# Patient Record
Sex: Male | Born: 1975 | Race: Black or African American | Hispanic: No | Marital: Single | Smoking: Current every day smoker
Health system: Southern US, Community
[De-identification: ages and names within clinical notes are randomized; demographics above are authoritative.]

## PROBLEM LIST (undated history)

## (undated) DIAGNOSIS — J45909 Unspecified asthma, uncomplicated: Secondary | ICD-10-CM

## (undated) HISTORY — PX: HIP SURGERY: SHX245

---

## 2016-12-19 ENCOUNTER — Emergency Department (HOSPITAL_COMMUNITY): Payer: Self-pay

## 2016-12-19 ENCOUNTER — Encounter (HOSPITAL_COMMUNITY): Payer: Self-pay | Admitting: Emergency Medicine

## 2016-12-19 DIAGNOSIS — Z5321 Procedure and treatment not carried out due to patient leaving prior to being seen by health care provider: Secondary | ICD-10-CM | POA: Insufficient documentation

## 2016-12-19 DIAGNOSIS — J45909 Unspecified asthma, uncomplicated: Secondary | ICD-10-CM | POA: Insufficient documentation

## 2016-12-19 MED ORDER — ALBUTEROL SULFATE (2.5 MG/3ML) 0.083% IN NEBU
INHALATION_SOLUTION | RESPIRATORY_TRACT | Status: AC
  Filled 2016-12-19: qty 6

## 2016-12-19 MED ORDER — ALBUTEROL SULFATE (2.5 MG/3ML) 0.083% IN NEBU
5.0000 mg | INHALATION_SOLUTION | Freq: Once | RESPIRATORY_TRACT | Status: AC
  Administered 2016-12-19: 5 mg via RESPIRATORY_TRACT

## 2016-12-19 NOTE — ED Triage Notes (Signed)
Pt reports asthma exacerbation X1 week, cough, SOB. Pt has been out of inhaler and does not have PCP

## 2016-12-20 ENCOUNTER — Encounter (HOSPITAL_COMMUNITY): Payer: Self-pay

## 2016-12-20 ENCOUNTER — Emergency Department (HOSPITAL_COMMUNITY)
Admission: EM | Admit: 2016-12-20 | Discharge: 2016-12-20 | Discharge status: Against Medical Advice | Payer: Self-pay | Attending: Emergency Medicine | Admitting: Emergency Medicine

## 2016-12-20 ENCOUNTER — Emergency Department (HOSPITAL_COMMUNITY)
Admission: EM | Admit: 2016-12-20 | Discharge: 2016-12-20 | Disposition: A | Discharge status: Home / Self Care | Payer: Self-pay | Attending: Emergency Medicine | Admitting: Emergency Medicine

## 2016-12-20 DIAGNOSIS — J45901 Unspecified asthma with (acute) exacerbation: Secondary | ICD-10-CM | POA: Insufficient documentation

## 2016-12-20 DIAGNOSIS — F17210 Nicotine dependence, cigarettes, uncomplicated: Secondary | ICD-10-CM | POA: Insufficient documentation

## 2016-12-20 DIAGNOSIS — J4521 Mild intermittent asthma with (acute) exacerbation: Secondary | ICD-10-CM

## 2016-12-20 HISTORY — DX: Unspecified asthma, uncomplicated: J45.909

## 2016-12-20 MED ORDER — ALBUTEROL SULFATE (2.5 MG/3ML) 0.083% IN NEBU
5.0000 mg | INHALATION_SOLUTION | Freq: Once | RESPIRATORY_TRACT | Status: AC
  Administered 2016-12-20: 5 mg via RESPIRATORY_TRACT

## 2016-12-20 MED ORDER — IPRATROPIUM-ALBUTEROL 0.5-2.5 (3) MG/3ML IN SOLN
3.0000 mL | Freq: Once | RESPIRATORY_TRACT | Status: AC
  Administered 2016-12-20: 3 mL via RESPIRATORY_TRACT
  Filled 2016-12-20: qty 3

## 2016-12-20 MED ORDER — ALBUTEROL SULFATE (2.5 MG/3ML) 0.083% IN NEBU
5.0000 mg | INHALATION_SOLUTION | Freq: Once | RESPIRATORY_TRACT | Status: AC
  Administered 2016-12-20: 5 mg via RESPIRATORY_TRACT
  Filled 2016-12-20: qty 6

## 2016-12-20 MED ORDER — PREDNISONE 10 MG PO TABS: 40.0000 mg | ORAL_TABLET | Freq: Every day | ORAL | 0 refills | Status: AC

## 2016-12-20 MED ORDER — ALBUTEROL SULFATE HFA 108 (90 BASE) MCG/ACT IN AERS
1.0000 | INHALATION_SPRAY | Freq: Four times a day (QID) | RESPIRATORY_TRACT | Status: DC | PRN
  Administered 2016-12-20: 1 via RESPIRATORY_TRACT
  Filled 2016-12-20: qty 6.7

## 2016-12-20 MED ORDER — ALBUTEROL SULFATE (2.5 MG/3ML) 0.083% IN NEBU
INHALATION_SOLUTION | RESPIRATORY_TRACT | Status: DC
Start: 2016-12-20 — End: 2016-12-21
  Filled 2016-12-20: qty 6

## 2016-12-20 MED ORDER — PREDNISONE 20 MG PO TABS
60.0000 mg | ORAL_TABLET | Freq: Once | ORAL | Status: AC
  Administered 2016-12-20: 60 mg via ORAL
  Filled 2016-12-20: qty 3

## 2016-12-20 NOTE — ED Triage Notes (Signed)
Pt. Returned from last night having exacerbation of asthma.  Pt. Left due to wait times.  Pt. Is here again with sob and exacerbation.  Pt. Need an inhaler to go home

## 2016-12-20 NOTE — ED Notes (Signed)
No answer when called to be roomed.

## 2016-12-20 NOTE — ED Notes (Signed)
ED Provider at bedside. 

## 2016-12-20 NOTE — Discharge Instructions (Signed)
Please read instructions below. Take the prednisone, 40 mg, for the next 4 days. Use the inhaler every 6 hours as needed for shortness of breath. Schedule an appointment with primary care to establish care and for management of your asthma. Return to the ER for difficulty breathing that is not relieved by inhaler, or new or concerning symptoms.

## 2016-12-20 NOTE — ED Provider Notes (Signed)
[facility]-EMERGENCY DEPT Provider Note   CSN: [id number] Arrival date & time: 12/20/16  1429     History   Chief Complaint Chief Complaint  Patient presents with  . Shortness of Breath    HPI Clayton Bailey is a 41 y.o. male w PMHx of asthma, presenting with asthma exacerbation that began yesterday evening. Pt reports he ran out of his albuterol yesterday and has been short of breath today. States he has some relief of symptoms after albuterol neb that was done in triage, however is still not at his baseline. Denies URI sx, CP, or any other complaints today. He does not have a primary care.  The history is provided by the patient.    Past Medical History:  Diagnosis Date  . Asthma     There are no active problems to display for this patient.   Past Surgical History:  Procedure Laterality Date  . HIP SURGERY         Home Medications    Prior to Admission medications   Medication Sig Start Date End Date Taking? Authorizing Provider  ePHEDrine-GuaiFENesin (PRIMATENE ASTHMA) 12.5-200 MG TABS Take 2 tablets by mouth daily as needed.   Yes [provider]  predniSONE (DELTASONE) 10 MG tablet Take 4 tablets (40 mg total) by mouth daily. 12/20/16 12/24/16  Russo, [place] N, PA-C    Family History No family history on file.  Social History Social History  Substance Use Topics  . Smoking status: Current Every Day Smoker    Packs/day: 0.25    Types: Cigarettes  . Smokeless tobacco: Never Used  . Alcohol use Yes     Comment: socially     Allergies   Patient has no known allergies.   Review of Systems Review of Systems  Constitutional: Negative for fever.  HENT: Negative for congestion and sore throat.   Respiratory: Positive for cough, shortness of breath and wheezing.   Cardiovascular: Negative for chest pain.  Gastrointestinal: Negative for abdominal pain.  All other systems reviewed and are negative.    Physical Exam Updated Vital Signs BP (!) 137/95    Pulse 87   Temp 98.1 F (36.7 C) (Oral)   Resp 18   Ht 5\' 4"  (1.626 m)   Wt 77.1 kg (170 lb)   SpO2 96%   BMI 29.18 kg/m   Physical Exam  Constitutional: He appears well-developed and well-nourished. No distress.  HENT:  Head: Normocephalic and atraumatic.  Mouth/Throat: Oropharynx is clear and moist.  Eyes: Conjunctivae are normal.  Neck: Normal range of motion. No tracheal deviation present.  Cardiovascular: Normal rate, regular rhythm, normal heart sounds and intact distal pulses.   Pulmonary/Chest: Effort normal. No stridor. No respiratory distress. He has no rales.  B/l inspiratory and expiratory wheezes and rhonchi. No increased work of breathing  Abdominal: Soft. Bowel sounds are normal.  Lymphadenopathy:    He has no cervical adenopathy.  Neurological: He is alert.  Skin: Skin is warm.  Psychiatric: He has a normal mood and affect. His behavior is normal.  Nursing note and vitals reviewed.    ED Treatments / Results  Labs (all labs ordered are listed, but only abnormal results are displayed) Labs Reviewed - No data to display  EKG  EKG Interpretation None       Radiology Dg Chest 2 View  Result Date: 12/19/2016 CLINICAL DATA:  Asthma exacerbation x1 week with cough. EXAM: CHEST  2 VIEW COMPARISON:  None. FINDINGS: The heart size and mediastinal contours  are within normal limits. The lungs are slightly hyperinflated without pneumonic consolidation. No effusion or pneumothorax. The visualized skeletal structures are unremarkable. IMPRESSION: Hyperinflation of the lungs. Findings may reflect the patient's reported underlying history of asthma. Electronically Signed   By: Tollie Ethavid  Kwon M.D.   On: 12/19/2016 20:42    Procedures Procedures (including critical care time)  Medications Ordered in ED Medications  albuterol (PROVENTIL) (2.5 MG/3ML) 0.083% nebulizer solution (  Not Given 12/20/16 1516)  albuterol (PROVENTIL HFA;VENTOLIN HFA) 108 (90 Base) MCG/ACT  inhaler 1-2 puff (not administered)  albuterol (PROVENTIL) (2.5 MG/3ML) 0.083% nebulizer solution 5 mg (5 mg Nebulization Given 12/20/16 1516)  albuterol (PROVENTIL) (2.5 MG/3ML) 0.083% nebulizer solution 5 mg (5 mg Nebulization Given 12/20/16 1948)  predniSONE (DELTASONE) tablet 60 mg (60 mg Oral Given 12/20/16 1948)  ipratropium-albuterol (DUONEB) 0.5-2.5 (3) MG/3ML nebulizer solution 3 mL (3 mLs Nebulization Given 12/20/16 2103)     Initial Impression / Assessment and Plan / ED Course  I have reviewed the triage vital signs and the nursing notes.  Pertinent labs & imaging results that were available during my care of the patient were reviewed by me and considered in my medical decision making (see chart for details).  Clinical Course as of Dec 21 2138  Fri Dec 20, 2016  2040 Pt reports significant improvement in symptoms after 2nd alb neb. Lung exam slightly improved, however wheezes and rhonchi still present. Will give duoneb.  [JR]    Clinical Course User Index [JR] Russo, [place] N, PA-C    Patient presenting with asthma exacerbation. B/L wheezes on initial exam, not in respiratory distress. CXR neg for PNA. Alb neb x2 and duoneb with gradual improvement in lung exam and symptoms. Prednisone given in the ED and pt will bd dc with 4 day burst. Pt states they are breathing at baseline. Pt has been instructed to continue using prescribed medications and to speak with PCP about today's exacerbation.  Pt is safe for discharge.  Discussed results, findings, treatment and follow up. Patient advised of return precautions. Patient verbalized understanding and agreed with plan.  Final Clinical Impressions(s) / ED Diagnoses   Final diagnoses:  Exacerbation of intermittent asthma, unspecified asthma severity    New Prescriptions New Prescriptions   PREDNISONE (DELTASONE) 10 MG TABLET    Take 4 tablets (40 mg total) by mouth daily.     Russo, [place] N, PA-C 12/20/16 2140    Tilden Fossaees,  Elizabeth, MD 12/21/16 [phone]

## 2017-01-15 ENCOUNTER — Emergency Department (HOSPITAL_COMMUNITY)
Admission: EM | Admit: 2017-01-15 | Discharge: 2017-01-15 | Disposition: A | Discharge status: Home / Self Care | Payer: Self-pay | Attending: Emergency Medicine | Admitting: Emergency Medicine

## 2017-01-15 ENCOUNTER — Encounter (HOSPITAL_COMMUNITY): Payer: Self-pay | Admitting: *Deleted

## 2017-01-15 DIAGNOSIS — R030 Elevated blood-pressure reading, without diagnosis of hypertension: Secondary | ICD-10-CM | POA: Insufficient documentation

## 2017-01-15 DIAGNOSIS — R0602 Shortness of breath: Secondary | ICD-10-CM

## 2017-01-15 DIAGNOSIS — J45901 Unspecified asthma with (acute) exacerbation: Secondary | ICD-10-CM | POA: Insufficient documentation

## 2017-01-15 DIAGNOSIS — Z720 Tobacco use: Secondary | ICD-10-CM

## 2017-01-15 DIAGNOSIS — F17210 Nicotine dependence, cigarettes, uncomplicated: Secondary | ICD-10-CM | POA: Insufficient documentation

## 2017-01-15 MED ORDER — MAGNESIUM SULFATE 2 GM/50ML IV SOLN
2.0000 g | Freq: Once | INTRAVENOUS | Status: AC
  Administered 2017-01-15: 2 g via INTRAVENOUS
  Filled 2017-01-15: qty 50

## 2017-01-15 MED ORDER — ALBUTEROL SULFATE HFA 108 (90 BASE) MCG/ACT IN AERS: 1.0000 | INHALATION_SPRAY | RESPIRATORY_TRACT | 0 refills | Status: AC | PRN

## 2017-01-15 MED ORDER — ALBUTEROL SULFATE (2.5 MG/3ML) 0.083% IN NEBU
5.0000 mg | INHALATION_SOLUTION | Freq: Once | RESPIRATORY_TRACT | Status: AC
  Administered 2017-01-15: 5 mg via RESPIRATORY_TRACT
  Filled 2017-01-15: qty 6

## 2017-01-15 MED ORDER — ALBUTEROL SULFATE (2.5 MG/3ML) 0.083% IN NEBU: 2.5000 mg | INHALATION_SOLUTION | RESPIRATORY_TRACT | 6 refills | Status: AC | PRN

## 2017-01-15 MED ORDER — ALBUTEROL SULFATE HFA 108 (90 BASE) MCG/ACT IN AERS
2.0000 | INHALATION_SPRAY | Freq: Once | RESPIRATORY_TRACT | Status: AC
Start: 2017-01-15 — End: 2017-01-15
  Administered 2017-01-15: 2 via RESPIRATORY_TRACT
  Filled 2017-01-15: qty 6.7

## 2017-01-15 MED ORDER — IPRATROPIUM BROMIDE 0.02 % IN SOLN
0.5000 mg | Freq: Once | RESPIRATORY_TRACT | Status: AC
  Administered 2017-01-15: 0.5 mg via RESPIRATORY_TRACT
  Filled 2017-01-15: qty 2.5

## 2017-01-15 NOTE — ED Provider Notes (Signed)
[facility]-EMERGENCY DEPT Provider Note   CSN: [id number] Arrival date & time: 01/15/17  2138     History   Chief Complaint Chief Complaint  Patient presents with  . Shortness of Breath    HPI Clayton Bailey is a 41 y.o. male with a PMHx of asthma, who presents to the ED with complaints of asthma exacerbation that began this morning around 8 AM. Patient states that he woke up feeling mildly short of breath and having wheezing, used his albuterol inhaler and primatene mist which helped somewhat; he was doing well until about 8 PM when he ran out of his inhaler and was feeling more SOB and having more wheezing at work; he called EMS who administered 1 albuterol neb, 1 duoneb, and 125mg  solumedrol with improvement of his symptoms. He states that his symptoms get aggravated by environmental exposures in his home (dust, allergens). He states this feels exactly the same as his prior asthma attacks. Chart review reveals that he was seen on 12/20/16 in the ED, given 2 albuterol nebs, 60 mg prednisone, and 1 DuoNeb at which time he improved so he was discharged home with prednisone 40 mg for 4 days. He states he never filled it the last time, however he has now taken it to the pharmacy to get it filled and will be picking it up tomorrow. He admits to being a cigarette smoker. He denies any prior intubations or ICU admissions for his asthma. He has a nebulizer machine at home however he does not have refills of albuterol nebulizer solution. He ran out of his inhaler today and doesn't have a refill. He does not currently have a PCP due to lack of insurance. Denies sick contacts. Of note, he has no known diagnosis of HTN, has never been on antiHTN meds.   He denies fevers, chills, cough, URI symptoms, CP, LE swelling, recent travel/surgery/immobilization, personal/family hx of DVT/PE, abd pain, N/V/D/C, hematuria, dysuria, myalgias, arthralgias, HA, vision changes, numbness, tingling, focal weakness, or any other  complaints at this time.   The history is provided by the patient and medical records. No language interpreter was used.  Shortness of Breath  This is a recurrent problem. The average episode lasts 14 hours. The problem occurs frequently.The current episode started 12 to 24 hours ago. The problem has not changed since onset.Associated symptoms include wheezing. Pertinent negatives include no fever, no headaches, no rhinorrhea, no sore throat, no ear pain, no cough, no chest pain, no vomiting, no abdominal pain and no leg swelling. The problem's precipitants include pollens (environmental exposure at home (dust)). Risk factors include smoking. He has tried beta-agonist inhalers for the symptoms. The treatment provided mild relief. He has had no prior hospitalizations. He has had prior ED visits. He has had no prior ICU admissions. Associated medical issues include asthma. Associated medical issues do not include PE, DVT or recent surgery.    Past Medical History:  Diagnosis Date  . Asthma     There are no active problems to display for this patient.   Past Surgical History:  Procedure Laterality Date  . HIP SURGERY         Home Medications    Prior to Admission medications   Medication Sig Start Date End Date Taking? Authorizing Provider  ePHEDrine-GuaiFENesin (PRIMATENE ASTHMA) 12.5-200 MG TABS Take 2 tablets by mouth daily as needed (sob).    Yes [provider]    Family History No family history on file.  Social History Social History  Substance Use Topics  . Smoking status: Current Every Day Smoker    Packs/day: 0.25    Types: Cigarettes  . Smokeless tobacco: Never Used  . Alcohol use Yes     Comment: socially     Allergies   Patient has no known allergies.   Review of Systems Review of Systems  Constitutional: Negative for chills and fever.  HENT: Negative for ear discharge, ear pain, rhinorrhea and sore throat.   Eyes: Negative for visual  disturbance.  Respiratory: Positive for shortness of breath and wheezing. Negative for cough.   Cardiovascular: Negative for chest pain and leg swelling.  Gastrointestinal: Negative for abdominal pain, constipation, diarrhea, nausea and vomiting.  Genitourinary: Negative for dysuria and hematuria.  Musculoskeletal: Negative for arthralgias and myalgias.  Skin: Negative for color change.  Allergic/Immunologic: Negative for immunocompromised state.  Neurological: Negative for weakness, numbness and headaches.  Psychiatric/Behavioral: Negative for confusion.   All other systems reviewed and are negative for acute change except as noted in the HPI.    Physical Exam Updated Vital Signs BP (!) 175/95 (BP Location: Right Arm)   Pulse 90   Temp 98.3 F (36.8 C) (Oral)   Resp 14   SpO2 99%   Physical Exam  Constitutional: He is oriented to person, place, and time. Vital signs are normal. He appears well-developed and well-nourished.  Non-toxic appearance. No distress.  Afebrile, nontoxic, NAD, mildly hypertensive 160s/90s during exam, similar to prior ED visit  HENT:  Head: Normocephalic and atraumatic.  Mouth/Throat: Oropharynx is clear and moist and mucous membranes are normal.  Eyes: Conjunctivae and EOM are normal. Right eye exhibits no discharge. Left eye exhibits no discharge.  Neck: Normal range of motion. Neck supple.  Cardiovascular: Normal rate, regular rhythm, normal heart sounds and intact distal pulses.  Exam reveals no gallop and no friction rub.   No murmur heard. RRR, nl s1/s2, no m/r/g, distal pulses intact, no pedal edema   Pulmonary/Chest: Effort normal. No respiratory distress. He has no decreased breath sounds. He has wheezes. He has no rhonchi. He has no rales.  Diffuse expiratory wheezing throughout all lung fields, no rhonchi/rales, no hypoxia or increased WOB, speaking in full sentences, SpO2 99% on RA   Abdominal: Soft. Normal appearance and bowel sounds are  normal. He exhibits no distension. There is no tenderness. There is no rigidity, no rebound, no guarding, no CVA tenderness, no tenderness at McBurney's point and negative Murphy's sign.  Musculoskeletal: Normal range of motion.  MAE x4 Strength and sensation grossly intact in all extremities Distal pulses intact No pedal edema, neg homan's bilaterally  Neurological: He is alert and oriented to person, place, and time. He has normal strength. No sensory deficit.  Skin: Skin is warm, dry and intact. No rash noted.  Psychiatric: He has a normal mood and affect.  Nursing note and vitals reviewed.    ED Treatments / Results  Labs (all labs ordered are listed, but only abnormal results are displayed) Labs Reviewed - No data to display  EKG  EKG Interpretation None       Radiology No results found.  Procedures Procedures (including critical care time)  Medications Ordered in ED Medications  magnesium sulfate IVPB 2 g 50 mL (2 g Intravenous New Bag/Given 01/15/17 2245)  albuterol (PROVENTIL HFA;VENTOLIN HFA) 108 (90 Base) MCG/ACT inhaler 2 puff (not administered)  albuterol (PROVENTIL) (2.5 MG/3ML) 0.083% nebulizer solution 5 mg (5 mg Nebulization Given 01/15/17 2246)  ipratropium (ATROVENT) nebulizer solution 0.5  mg (0.5 mg Nebulization Given 01/15/17 2250)     Initial Impression / Assessment and Plan / ED Course  I have reviewed the triage vital signs and the nursing notes.  Pertinent labs & imaging results that were available during my care of the patient were reviewed by me and considered in my medical decision making (see chart for details).     41 y.o. male here with asthma exacerbation starting today; used up his inhaler so had to call EMS; Was given 1 albuterol tx, 1 duoneb tx, and 125mg  solumedrol en route which helped. On exam, diffuse expiratory wheezing throughout, no rhonchi/rales, no increased WOB, no hypoxia or tachycardia, no LE swelling. Also noted to be mildly  hypertensive similar to prior visits; at last visit, BP fluctuated even down to the 130s/80s so it could be situational/med related, asymptomatic at this time so doubt need for emergent work up at this time, and doubt need to start HTN meds now, but advised DASH diet, tobacco cessation, and f/up with a PCP for ongoing evaluation/management of this. Doubt need for labs/imaging at this time, will give duoneb and Mg IV to see if we can get resolution of his wheezing; will reassess shortly.   11:13 PM Pt feeling significantly improved and lung sounds greatly improved, still has very faint wheeze in upper lungs, will give 2 puffs of inhaler and then I feel he could reasonable go home (especially since he has nebulizer at home so he can use that if need be as well). Will send home with inhaler, refill albuterol nebs, and advised daily antihistamine use as well as other OTC remedies to help with relief of symptoms. Of note, BP trending down to 140s/80s; advised DASH diet, smoking cessation, and f/up with [facility] in 1wk for recheck and to establish medical care. Pt has prednisone rx from last visit, advised use of this starting tomorrow. I explained the diagnosis and have given explicit precautions to return to the ER including for any other new or worsening symptoms. The patient understands and accepts the medical plan as it's been dictated and I have answered their questions. Discharge instructions concerning home care and prescriptions have been given. The patient is STABLE and is discharged to home in good condition.    Final Clinical Impressions(s) / ED Diagnoses   Final diagnoses:  Exacerbation of asthma, unspecified asthma severity, unspecified whether persistent  SOB (shortness of breath)  Elevated BP without diagnosis of hypertension  Tobacco user    New Prescriptions New Prescriptions   ALBUTEROL (PROVENTIL HFA;VENTOLIN HFA) 108 (90 BASE) MCG/ACT INHALER    Inhale 1-2 puffs into the lungs every 4  (four) hours as needed for wheezing or shortness of breath.   ALBUTEROL (PROVENTIL) (2.5 MG/3ML) 0.083% NEBULIZER SOLUTION    Take 3 mLs (2.5 mg total) by nebulization every 4 (four) hours as needed for wheezing or shortness of breath.     [address], SykestonMercedes, [place] JerseyPA-C 01/15/17 2314    Mancel BaleWentz, Elliott, MD 01/15/17 [phone]

## 2017-01-15 NOTE — ED Notes (Signed)
Bed: WA20 Expected date:  Expected time:  Means of arrival:  Comments: Sob

## 2017-01-15 NOTE — ED Triage Notes (Signed)
Per EMS, pt had sob all day, hx asthma. Pt used inhaler at 8AM today. Pt has prescription for prednisone, which he has not picked up.  Pt received 1 albuterol, 1 duoneb, 125 solumedrol. Pt wheezing in all fields prior to neb treatment. Pt clear in left upper, faint wheezing after neb treatment.  Pt denies pain.  148/76 HR 91 RR18 100% on neb treatment

## 2017-01-15 NOTE — Discharge Instructions (Signed)
Continue to stay well-hydrated. Use Mucinex for cough suppression/expectoration of mucus. Use over the counter antihistamines such as zyrtec or allegra to decrease symptoms and frequency of asthma attacks. Use albuterol inhaler and/or albuterol nebulizer as directed, as needed for cough/chest congestion/wheezing/shortness of breath/etc. Take home prednisone as directed for your asthma exacerbation, starting tomorrow since you received today's dose in the ER today. STOP SMOKING! Your blood pressure was elevated today; eat a low salt diet and follow up with a regular doctor for ongoing management of this. [facility]up with [facility] in 5-7 days for recheck of ongoing symptoms and to establish medical care. Return to emergency department for emergent changing or worsening of symptoms.

## 2019-06-28 ENCOUNTER — Ambulatory Visit: Payer: Self-pay | Attending: Internal Medicine

## 2019-06-30 ENCOUNTER — Ambulatory Visit: Payer: Self-pay | Attending: Internal Medicine

## 2019-06-30 DIAGNOSIS — U071 COVID-19: Secondary | ICD-10-CM | POA: Insufficient documentation

## 2019-06-30 DIAGNOSIS — Z20822 Contact with and (suspected) exposure to covid-19: Secondary | ICD-10-CM

## 2019-07-03 LAB — NOVEL CORONAVIRUS, NAA

## 2019-07-05 ENCOUNTER — Ambulatory Visit: Payer: Self-pay | Attending: Internal Medicine

## 2019-07-05 DIAGNOSIS — Z20822 Contact with and (suspected) exposure to covid-19: Secondary | ICD-10-CM | POA: Insufficient documentation

## 2019-07-06 LAB — NOVEL CORONAVIRUS, NAA: SARS-CoV-2, NAA: NOT DETECTED

## 2020-01-26 ENCOUNTER — Emergency Department (HOSPITAL_COMMUNITY)
Admission: EM | Admit: 2020-01-26 | Discharge: 2020-01-26 | Disposition: A | Discharge status: Home / Self Care | Payer: Self-pay | Attending: Emergency Medicine | Admitting: Emergency Medicine

## 2020-01-26 ENCOUNTER — Other Ambulatory Visit: Payer: Self-pay

## 2020-01-26 ENCOUNTER — Encounter (HOSPITAL_COMMUNITY): Payer: Self-pay

## 2020-01-26 DIAGNOSIS — F17210 Nicotine dependence, cigarettes, uncomplicated: Secondary | ICD-10-CM | POA: Insufficient documentation

## 2020-01-26 DIAGNOSIS — Y990 Civilian activity done for income or pay: Secondary | ICD-10-CM | POA: Insufficient documentation

## 2020-01-26 DIAGNOSIS — S61412A Laceration without foreign body of left hand, initial encounter: Secondary | ICD-10-CM | POA: Insufficient documentation

## 2020-01-26 DIAGNOSIS — Y9389 Activity, other specified: Secondary | ICD-10-CM | POA: Insufficient documentation

## 2020-01-26 DIAGNOSIS — J45909 Unspecified asthma, uncomplicated: Secondary | ICD-10-CM | POA: Insufficient documentation

## 2020-01-26 DIAGNOSIS — W268XXA Contact with other sharp object(s), not elsewhere classified, initial encounter: Secondary | ICD-10-CM | POA: Insufficient documentation

## 2020-01-26 DIAGNOSIS — Y9289 Other specified places as the place of occurrence of the external cause: Secondary | ICD-10-CM | POA: Insufficient documentation

## 2020-01-26 NOTE — ED Triage Notes (Signed)
Patient states last night at 730pm at work he cut his left hand while cutting boxes. He applied liquid bandage and then bust it back open. Bleeding controlled.

## 2020-01-26 NOTE — Discharge Instructions (Addendum)
Keep wound covered to protect from dirt  Steri strips will come off on their own, may reapply if they come off before 5 days  Return for increased pain swelling redness pus

## 2020-01-26 NOTE — ED Provider Notes (Signed)
[facility]-EMERGENCY DEPT Provider Note   CSN: [id number] Arrival date & time: 01/26/20  1911     History Chief Complaint  Patient presents with  . Laceration    Clayton Bailey is a 44 y.o. male presents to the ER for evaluation of laceration in the left hand that occurred last night while at work.  He was cutting boxes.  Sustained a cut from a box cutter.  He applied liquid bandage to the wound last night but states when at work today opened back up.  Has associated local pain.  No more bleeding.  Denies distal tingling or loss of sensation.  Tetanus last around 2014 or 2015.  HPI     Past Medical History:  Diagnosis Date  . Asthma     There are no problems to display for this patient.   Past Surgical History:  Procedure Laterality Date  . HIP SURGERY         No family history on file.  Social History   Tobacco Use  . Smoking status: Current Every Day Smoker    Packs/day: 0.25    Types: Cigarettes  . Smokeless tobacco: Never Used  Substance Use Topics  . Alcohol use: Yes    Comment: socially  . Drug use: Yes    Types: Marijuana    Home Medications Prior to Admission medications   Medication Sig Start Date End Date Taking? Authorizing Provider  albuterol (PROVENTIL HFA;VENTOLIN HFA) 108 (90 Base) MCG/ACT inhaler Inhale 1-2 puffs into the lungs every 4 (four) hours as needed for wheezing or shortness of breath. 01/15/17   Street, [place], PA-C  albuterol (PROVENTIL) (2.5 MG/3ML) 0.083% nebulizer solution Take 3 mLs (2.5 mg total) by nebulization every 4 (four) hours as needed for wheezing or shortness of breath. 01/15/17   Street, [place], PA-C  ePHEDrine-GuaiFENesin (PRIMATENE ASTHMA) 12.5-200 MG TABS Take 2 tablets by mouth daily as needed (sob).     [provider]    Allergies    Patient has no known allergies.  Review of Systems   Review of Systems  Skin: Positive for wound.  All other systems reviewed and are  negative.   Physical Exam Updated Vital Signs BP 135/87   Pulse 76   Temp 98.4 F (36.9 C) (Oral)   Resp 16   SpO2 99%   Physical Exam Constitutional:      Appearance: He is well-developed.  HENT:     Head: Normocephalic.     Nose: Nose normal.  Eyes:     General: Lids are normal.  Cardiovascular:     Rate and Rhythm: Normal rate.     Comments: Radial pulses intact in the left hand.  Brisk cap refill in the left fingertips. Pulmonary:     Effort: Pulmonary effort is normal. No respiratory distress.  Musculoskeletal:        General: Normal range of motion.     Cervical back: Normal range of motion.     Comments: Full range of motion of the left wrist, digits.  Skin:    Comments: Approximate 3 cm straight slightly gaping laceration palmar aspect of the left hand.  Hemostatic.  Minimally locally tender.  No erythema, warmth, fluctuance, drainage.  Neurological:     Mental Status: He is alert.     Comments: Sensation in the radial, medial and ulnar nerve distribution intact in left hand  Psychiatric:        Behavior: Behavior normal.     ED Results /  Procedures / Treatments   Labs (all labs ordered are listed, but only abnormal results are displayed) Labs Reviewed - No data to display  EKG None  Radiology No results found.  Procedures .Marland KitchenLaceration Repair  Date/Time: 01/26/2020 11:09 PM Performed by: Liberty Handy, PA-C Authorized by: Liberty Handy, PA-C   Consent:    Consent obtained:  Verbal   Consent given by:  Patient   Risks discussed:  Infection, need for additional repair, pain, poor cosmetic result and poor wound healing   Alternatives discussed:  No treatment and delayed treatment Universal protocol:    Procedure explained and questions answered to patient or proxy's satisfaction: yes     Patient identity confirmed:  Verbally with patient Anesthesia (see MAR for exact dosages):    Anesthesia method:  None Laceration details:    Length  (cm):  3 Repair type:    Repair type:  Simple Pre-procedure details:    Preparation:  Patient was prepped and draped in usual sterile fashion and imaging obtained to evaluate for foreign bodies Exploration:    Wound extent: no foreign bodies/material noted, no nerve damage noted, no tendon damage noted, no underlying fracture noted and no vascular damage noted     Contaminated: no   Treatment:    Wound cleansed with: alcohol wipe.   Amount of cleaning:  Standard   Irrigation method:  Tap   Visualized foreign bodies/material removed: no   Skin repair:    Repair method:  Steri-Strips   Number of Steri-Strips:  2 Approximation:    Approximation:  Close Post-procedure details:    Dressing: gauze, tube gauze, coban    Patient tolerance of procedure:  Tolerated well, no immediate complications   (including critical care time)  Medications Ordered in ED Medications - No data to display  ED Course  I have reviewed the triage vital signs and the nursing notes.  Pertinent labs & imaging results that were available during my care of the patient were reviewed by me and considered in my medical decision making (see chart for details).    MDM Rules/Calculators/A&P                          Accidental left hand laceration while at work.  Laceration is small, hemostatic, minimally gaping at the center.  No evidence of significant soft tissue, nerve, vascular or ligamentous injury.  No visualized foreign bodies.  I do not think x-rays are necessary as wound is very superficial, small.  Tetanus has been within the last 10 years.  Wound was cleansed by me.  Discussed options of wound repair including Dermabond, Steri-Strips, sutures.  Patient would like to avoid having to return to the ER/urgent care for suture removal.  He opted for Steri-Strips.  Wound repaired in the ER by me without immediate complications.  Recommended patient keep the wound wrapped while at work to prevent dehiscence.  He was  given extra Steri-Strips and dressing supplies to keep the wound covered while at work.  Return precautions discussed.  Is comfortable with this plan. Final Clinical Impression(s) / ED Diagnoses Final diagnoses:  Laceration of left hand without foreign body, initial encounter    Rx / DC Orders ED Discharge Orders    None       Jerrell Mylar 01/26/20 2312    Arby Barrette, MD 01/29/20 [phone]

## 2020-06-30 ENCOUNTER — Emergency Department (HOSPITAL_BASED_OUTPATIENT_CLINIC_OR_DEPARTMENT_OTHER): Payer: Self-pay

## 2020-06-30 ENCOUNTER — Encounter (HOSPITAL_BASED_OUTPATIENT_CLINIC_OR_DEPARTMENT_OTHER): Payer: Self-pay | Admitting: *Deleted

## 2020-06-30 ENCOUNTER — Other Ambulatory Visit: Payer: Self-pay

## 2020-06-30 ENCOUNTER — Emergency Department (HOSPITAL_BASED_OUTPATIENT_CLINIC_OR_DEPARTMENT_OTHER)
Admission: EM | Admit: 2020-06-30 | Discharge: 2020-06-30 | Disposition: A | Discharge status: Home / Self Care | Payer: Self-pay | Attending: Emergency Medicine | Admitting: Emergency Medicine

## 2020-06-30 DIAGNOSIS — W231XXA Caught, crushed, jammed, or pinched between stationary objects, initial encounter: Secondary | ICD-10-CM | POA: Insufficient documentation

## 2020-06-30 DIAGNOSIS — F17210 Nicotine dependence, cigarettes, uncomplicated: Secondary | ICD-10-CM | POA: Insufficient documentation

## 2020-06-30 DIAGNOSIS — S82852A Displaced trimalleolar fracture of left lower leg, initial encounter for closed fracture: Secondary | ICD-10-CM | POA: Insufficient documentation

## 2020-06-30 DIAGNOSIS — J45909 Unspecified asthma, uncomplicated: Secondary | ICD-10-CM | POA: Insufficient documentation

## 2020-06-30 DIAGNOSIS — Y990 Civilian activity done for income or pay: Secondary | ICD-10-CM | POA: Insufficient documentation

## 2020-06-30 MED ORDER — OXYCODONE-ACETAMINOPHEN 5-325 MG PO TABS
1.0000 | ORAL_TABLET | Freq: Once | ORAL | Status: AC
  Administered 2020-06-30: 1 via ORAL
  Filled 2020-06-30: qty 1

## 2020-06-30 MED ORDER — HYDROCODONE-ACETAMINOPHEN 5-325 MG PO TABS: 1.0000 | ORAL_TABLET | Freq: Four times a day (QID) | ORAL | 0 refills | Status: AC | PRN

## 2020-06-30 NOTE — ED Triage Notes (Signed)
Left ankle injury 2 days ago. Crushed in between a pole and power jack. Chemical burn to his foot, swelling, and pain.

## 2020-06-30 NOTE — ED Notes (Signed)
Patient transported to X-ray 

## 2020-06-30 NOTE — ED Provider Notes (Signed)
[facility] EMERGENCY DEPARTMENT Provider Note   CSN: [id number] Arrival date & time: 06/30/20  1802     History Chief Complaint  Patient presents with  . Ankle Injury    Clayton Bailey is a 45 y.o. male with a past medical history significant for asthma who presents to the ED due to left ankle/foot pain x1 day.  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  8/10 pain worse with movement.  He has been nonweightbearing since the accident and ambulating with a walker. He has been taking naproxen with mild relief. Pain associated with edema and a burn to lateral aspect of left ankle.  He also notes intermittent numbness/tingling.  No previous left ankle/foot injury. No other injuries.   History obtained from patient and past medical records. No interpreter used during encounter.      Past Medical History:  Diagnosis Date  . Asthma     There are no problems to display for this patient.   Past Surgical History:  Procedure Laterality Date  . HIP SURGERY         No family history on file.  Social History   Tobacco Use  . Smoking status: Current Every Day Smoker    Packs/day: 0.25    Types: Cigarettes  . Smokeless tobacco: Never Used  Substance Use Topics  . Alcohol use: Yes    Comment: socially  . Drug use: Yes    Types: Marijuana    Home Medications Prior to Admission medications   Medication Sig Start Date End Date Taking? Authorizing Provider  HYDROcodone-acetaminophen (NORCO/VICODIN) 5-325 MG tablet Take 1 tablet by mouth every 6 (six) hours as needed for severe pain. 06/30/20  Yes Aberman, Caroline C, PA-C  albuterol (PROVENTIL HFA;VENTOLIN HFA) 108 (90 Base) MCG/ACT inhaler Inhale 1-2 puffs into the lungs every 4 (four) hours as needed for wheezing or shortness of breath. 01/15/17   Street, [place], PA-C  albuterol (PROVENTIL) (2.5 MG/3ML) 0.083% nebulizer solution Take 3 mLs (2.5 mg total) by  nebulization every 4 (four) hours as needed for wheezing or shortness of breath. 01/15/17   Street, [place], PA-C  ePHEDrine-guaiFENesin 12.5-200 MG TABS Take 2 tablets by mouth daily as needed (sob).     [provider]    Allergies    Patient has no known allergies.  Review of Systems   Review of Systems  Musculoskeletal: Positive for arthralgias, gait problem and joint swelling.  Neurological: Positive for numbness.  All other systems reviewed and are negative.   Physical Exam Updated Vital Signs BP (!) 157/97 (BP Location: Left Arm)   Pulse 84   Temp 98.4 F (36.9 C) (Oral)   Resp 18   Ht 5\' 4"  (1.626 m)   Wt 81.6 kg   SpO2 100%   BMI 30.90 kg/m   Physical Exam Vitals and nursing note reviewed.  Constitutional:      General: He is not in acute distress. HENT:     Head: Normocephalic.  Eyes:     Pupils: Pupils are equal, round, and reactive to light.  Cardiovascular:     Rate and Rhythm: Normal rate and regular rhythm.     Pulses: Normal pulses.     Heart sounds: Normal heart sounds. No murmur heard. No friction rub. No gallop.   Pulmonary:     Effort: Pulmonary effort is normal.     Breath sounds: Normal breath sounds.  Abdominal:     General: Abdomen is flat. Bowel sounds are normal. There is no distension.     Palpations: Abdomen is soft.     Tenderness: There is no abdominal tenderness. There is no guarding or rebound.  Musculoskeletal:     Cervical back: Neck supple.     Comments: Tenderness throughout left ankle and foot with edema.  Pedal pulses palpable.  Sensation intact.  Blistering burn over lateral malleolus.  Limited range of motion of left ankle and toes.  No tenderness over left knee.  Full range of motion of left knee.  No proximal fibular tenderness.  Skin:    Comments: Blistering burn over left lateral malleolus.  Neurological:     General: No focal deficit present.  Psychiatric:        Mood and Affect: Mood normal.        Behavior:  Behavior normal.     ED Results / Procedures / Treatments   Labs (all labs ordered are listed, but only abnormal results are displayed) Labs Reviewed - No data to display  EKG None  Radiology DG Ankle Complete Left  Result Date: 06/30/2020 CLINICAL DATA:  45 year old male with concern for left ankle fracture. EXAM: LEFT ANKLE COMPLETE - 3+ VIEW COMPARISON:  None. FINDINGS: Minimally displaced fractures of the medial and lateral malleoli with approximately 1-2 mm lateral displacement of the distal fracture fragments. There is a mildly displaced fracture of the posterior malleolus. The bones are mildly osteopenic for age. Mild posterior subluxation of the ankle mortise. There is diffuse subcutaneous edema and soft tissue swelling of the ankle. No radiopaque foreign object or soft tissue gas. IMPRESSION: Trimalleolar fracture with minimal posterior subluxation of the ankle mortise. Electronically Signed   By: Elgie Collard M.D.   On: 06/30/2020 18:55   CT Ankle Left Wo Contrast  Result Date: 06/30/2020 CLINICAL DATA:  Left ankle fractures due to a fall off a piece of machinery today. Initial encounter. EXAM: CT OF THE LEFT ANKLE WITHOUT CONTRAST TECHNIQUE: Multidetector CT imaging of the left ankle was performed according to the standard protocol. Multiplanar CT image reconstructions were also generated. COMPARISON:  Plain films left ankle earlier today. FINDINGS: Bones/Joint/Cartilage The patient has a mildly comminuted fracture of the distal fibula which originates in the posterior cortex of the fibula approximately 3.3 cm above the tip of the lateral malleolus and exits the anterior cortex approximately 2 cm above the tip of the lateral malleolus. The distal fragment demonstrates 0.4 cm posterior displacement and minimal impaction. Also seen is a fracture of the posterior malleolus. The main fracture fragment measures 1.5 cm AP by 3.2 cm transverse at the plafond and 5.1 cm craniocaudal.  Superior displacement of the fragment results in a step-off at the plafond of 0.5 cm. This fragment is posteriorly displaced approximately 0.6 cm. Mildly comminuted fracture of the medial malleolus demonstrates nondisplaced. The talus is posteriorly subluxed approximately 1 cm off of the tibial plafond. No other bony or joint abnormality is identified. Ligaments Suboptimally assessed by CT. Muscles and Tendons Tendons appear intact.  No entrapment. Soft tissues Soft tissue contusion and hematoma are seen about the patient's fractures. IMPRESSION: Acute trimalleolar fracture and posterior subluxation of the talus out of the tibiotalar joint as described above. Electronically Signed   By: Drusilla Kanner M.D.   On: 06/30/2020 20:27   DG Foot Complete Left  Result Date: 06/30/2020 CLINICAL DATA:  Left ankle pain and difficulty weight-bearing after a fall. Initial encounter. EXAM:  LEFT FOOT - COMPLETE 3+ VIEW COMPARISON:  None. FINDINGS: Soft tissues appear swollen. Ankle fractures are noted as seen on dedicated plain films of the ankle this same day. No other fracture, dislocation or bony abnormality is identified. No evidence of arthropathy. IMPRESSION: Ankle fractures as seen on dedicated plain films of the ankle this same day. No other acute bony abnormality. Soft tissue swelling. Electronically Signed   By: Drusilla Kanner M.D.   On: 06/30/2020 19:30    Procedures Procedures   Medications Ordered in ED Medications  oxyCODONE-acetaminophen (PERCOCET/ROXICET) 5-325 MG per tablet 1 tablet (1 tablet Oral Given 06/30/20 1904)    ED Course  I have reviewed the triage vital signs and the nursing notes.  Pertinent labs & imaging results that were available during my care of the patient were reviewed by me and considered in my medical decision making (see chart for details).  Clinical Course as of 06/30/20 2124  Fri Jun 30, 2020  1945 Discussed case with Dr. Linna Caprice who recommends posterior splint  with U and CT ankle with outpatient follow-up with Dr. Victorino Dike [CA]    Clinical Course User Index [CA] Mannie Stabile, PA-C   MDM Rules/Calculators/A&P                         45 year old male presents the ED due to left ankle/foot pain after a crush injury 1 day ago at work.  Stable vitals.  Patient in no acute distress and nontoxic-appearing.  Left lower extremity neurovascularly intact with soft compartments.  Doubt compartment syndrome.  Tenderness throughout left ankle/foot with blistering burn over lateral malleolus.  X-rays obtained at triage which I personally reviewed which demonstrates:  IMPRESSION:  Trimalleolar fracture with minimal posterior subluxation of the  ankle mortise.   Discussed with Dr. Linna Caprice with orthopedics. See note above. CT ankle personally reviewed which demonstrates: IMPRESSION:  Acute trimalleolar fracture and posterior subluxation of the talus  out of the tibiotalar joint as described above.     Posterior splint placed.  RICE discussed with patient.  Patient discharged with pain medication.  Orthopedics number given to patient discharge.  Instructed patient to call Monday to schedule point for further evaluation. Strict ED precautions discussed with patient. Patient states understanding and agrees to plan. Patient discharged home in no acute distress and stable vitals.  Final Clinical Impression(s) / ED Diagnoses Final diagnoses:  Closed trimalleolar fracture of left ankle, initial encounter    Rx / DC Orders ED Discharge Orders         Ordered    HYDROcodone-acetaminophen (NORCO/VICODIN) 5-325 MG tablet  Every 6 hours PRN        06/30/20 2123           Jesusita Oka 06/30/20 2125    Rolan Bucco, MD 06/30/20 2303

## 2020-06-30 NOTE — Discharge Instructions (Signed)
As discussed, you broke your ankle. You will need surgery.  I have included the number of the orthopedic surgeon.  Please call Monday to schedule appointment for further evaluation.  Continue to elevate your leg.  Do not bear any weight on leg until evaluated by orthopedist.  I am sending you home with a pain medication.  Take for severe pain.  You may take over-the-counter ibuprofen or Tylenol as needed for mild to moderate pain.  Return to the ER for new or worsening symptoms.

## 2022-12-28 IMAGING — CR DG FOOT COMPLETE 3+V*L*
3 series · 3 of 3 positions shown · non-contrast
Comparison: None.

CLINICAL DATA: Left ankle pain and difficulty weight-bearing after
a fall. Initial encounter.

EXAM:
LEFT FOOT - COMPLETE 3+ VIEW

[t foot ap left]
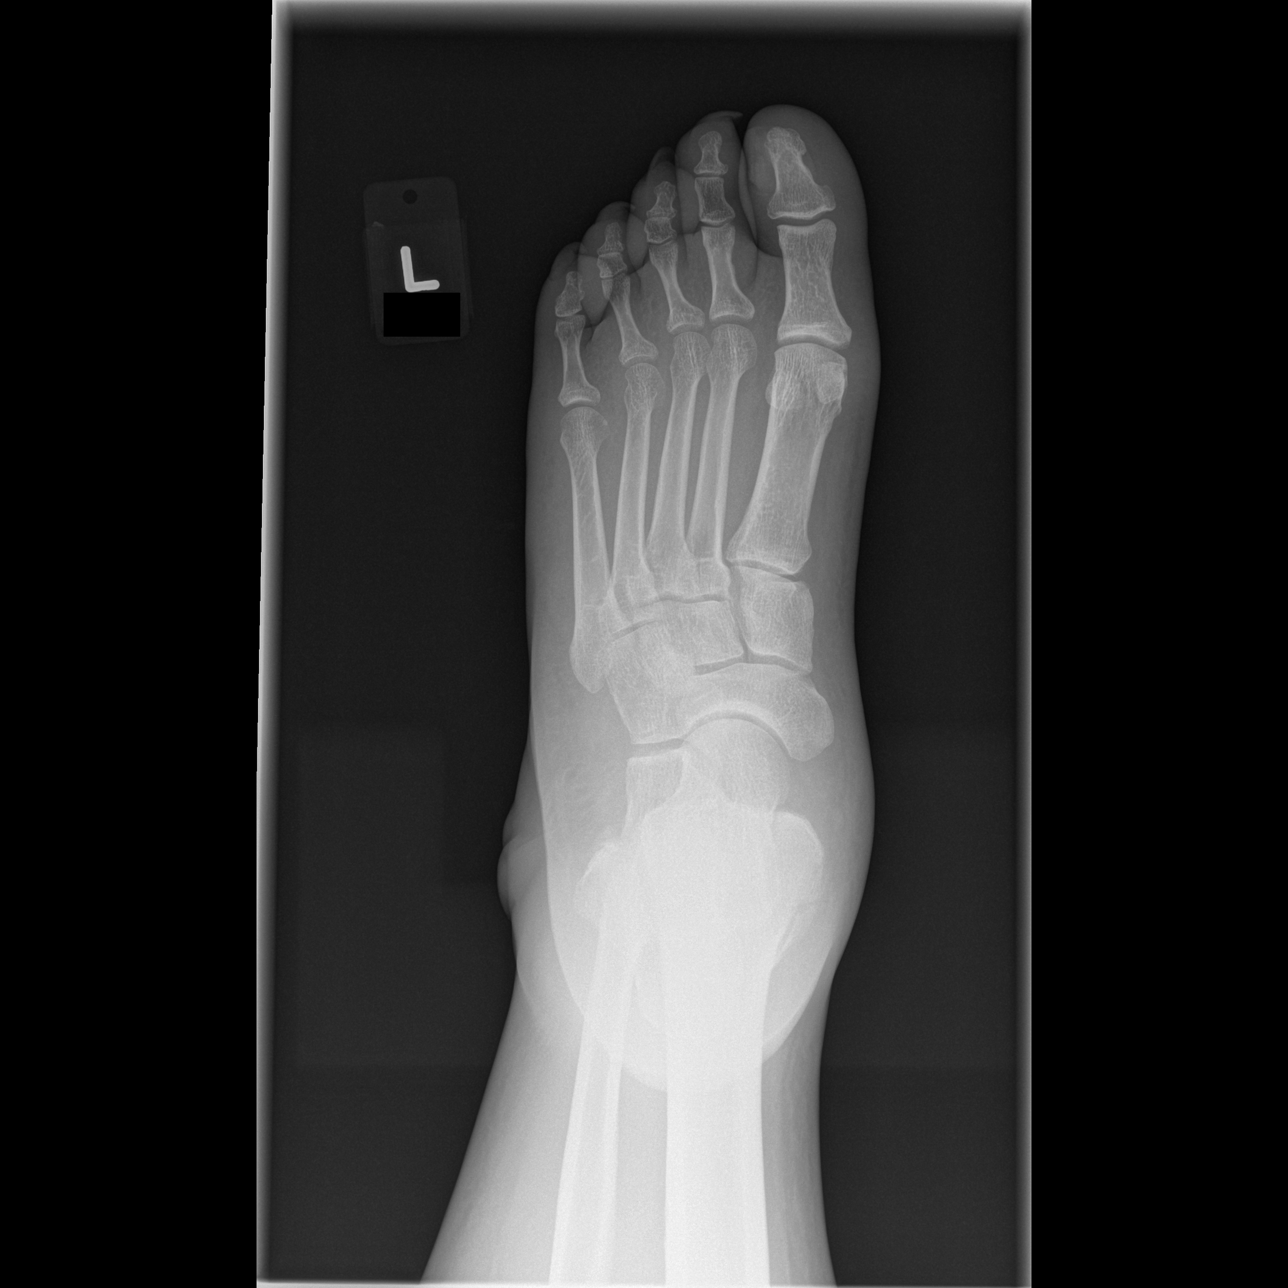

[t foot oblique left]
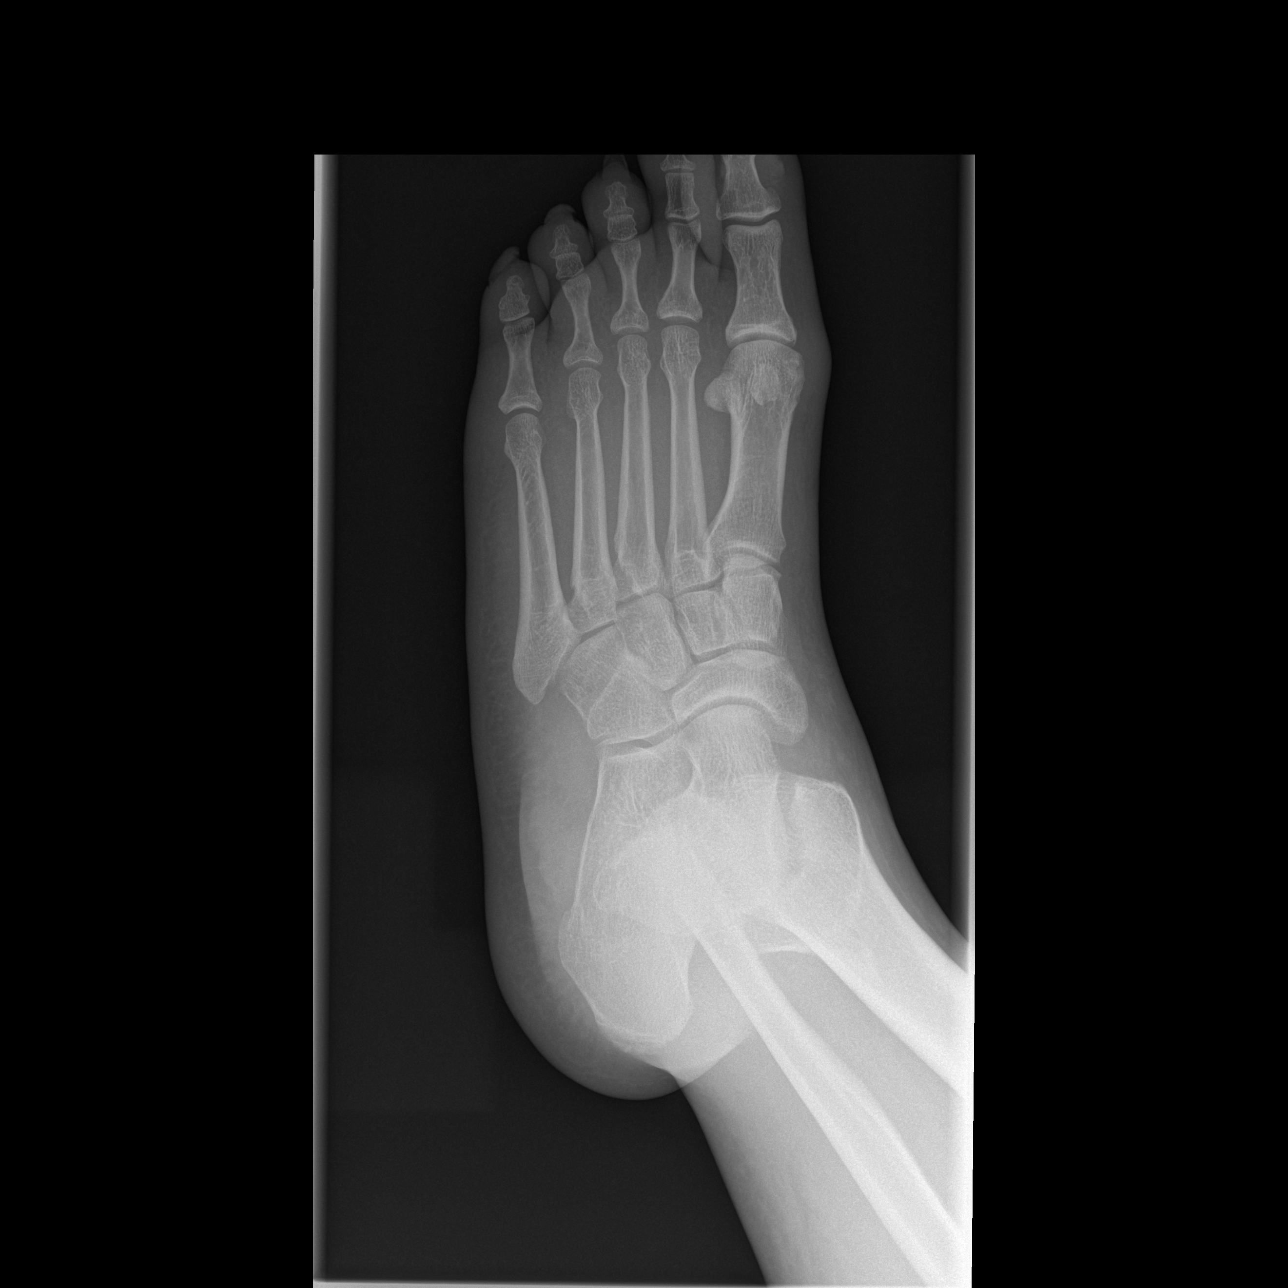

[t foot lat left]
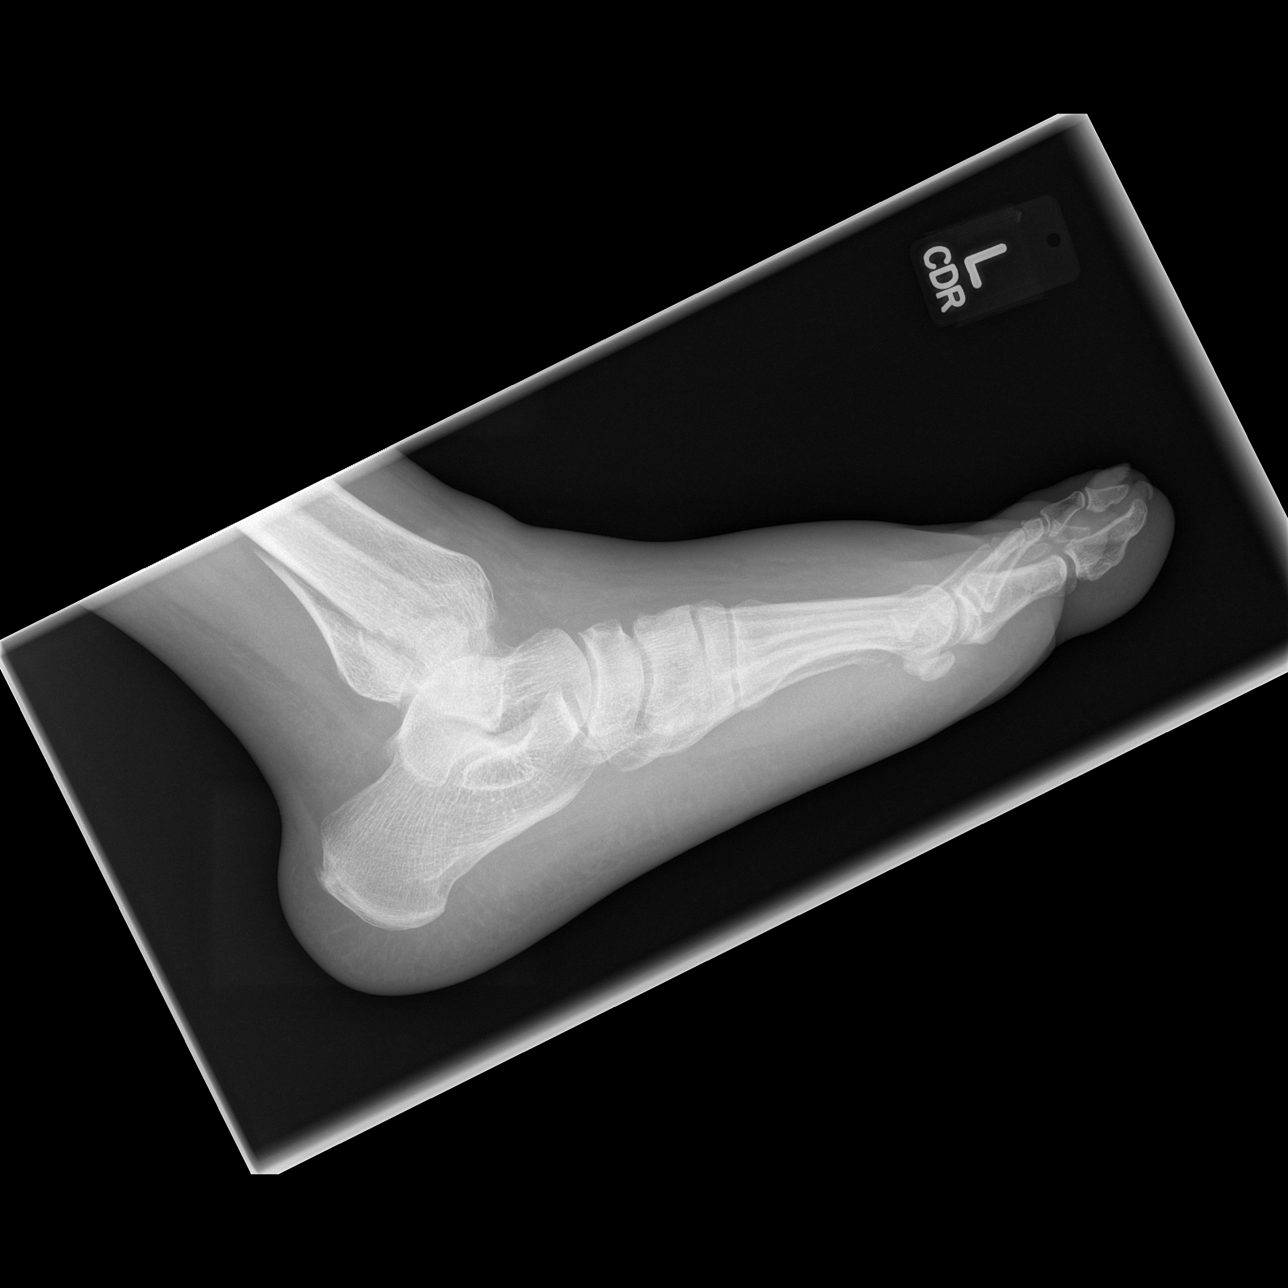

[3 of 3 positions shown; findings below may reference images not displayed]

FINDINGS: Soft tissues appear swollen. Ankle fractures are noted as seen on
dedicated plain films of the ankle this same day. No other fracture,
dislocation or bony abnormality is identified. No evidence of
arthropathy.
IMPRESSION: Ankle fractures as seen on dedicated plain films of the ankle this
same day. No other acute bony abnormality.

Soft tissue swelling.

## 2022-12-28 IMAGING — CT CT ANKLE*L* W/O CM
3 series · 15 of 33 positions shown, 18 images · non-contrast
Comparison: Plain films left ankle earlier today.

CLINICAL DATA: Left ankle fractures due to a fall off a piece of
machinery today. Initial encounter.

EXAM:
CT OF THE LEFT ANKLE WITHOUT CONTRAST
TECHNIQUE: Multidetector CT imaging of the left ankle was performed according
to the standard protocol. Multiplanar CT image reconstructions were
also generated.

[Series 7: axial st · axial · 0.34mm/px · z∈[-1265,-1108]mm · 7 of 189 slices shown, 9 images]
[im 15/189  soft-tissue]
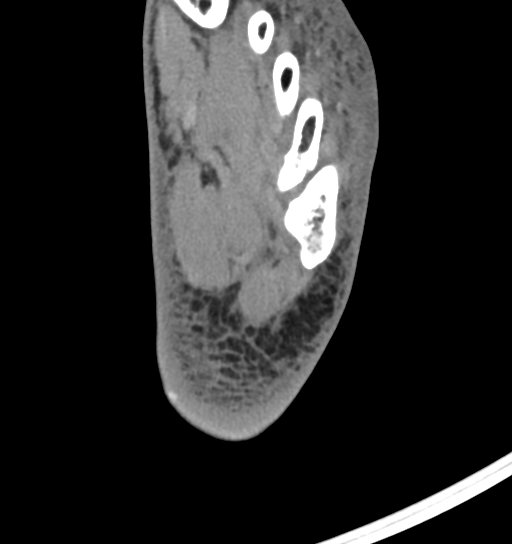
[im 15/189  bone]
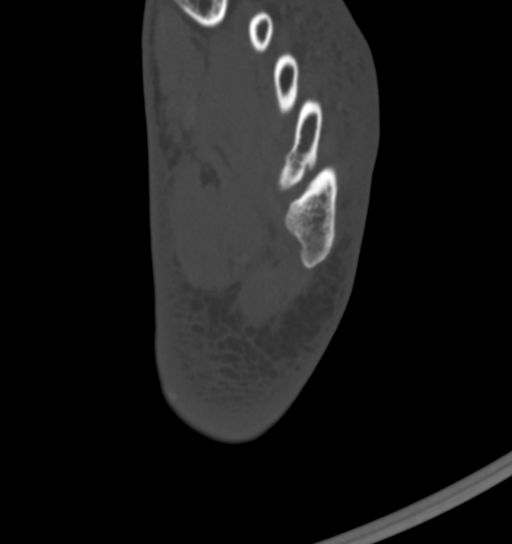
[im 44/189  bone]
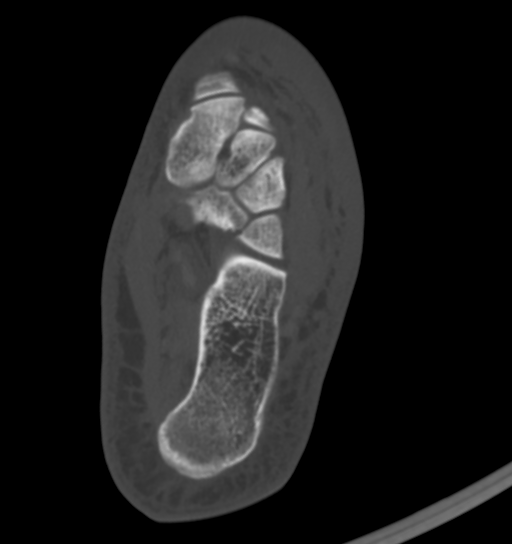
[im 73/189  bone]
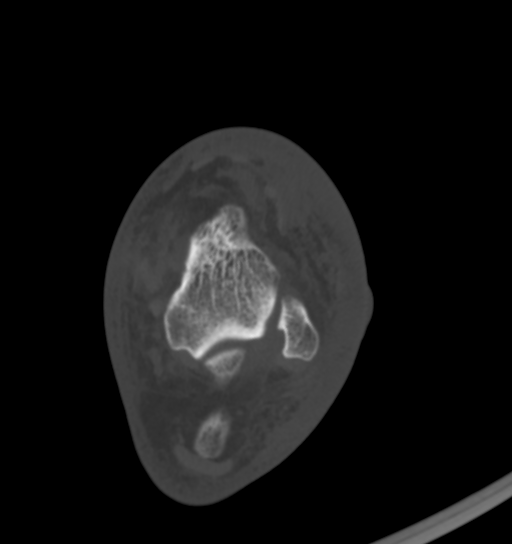
[im 102/189  bone]
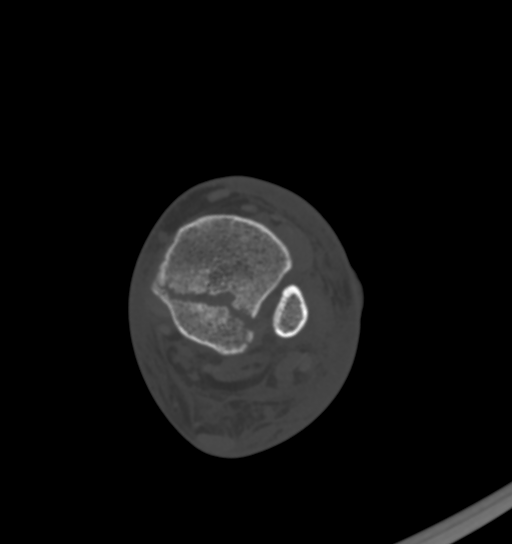
[im 116/189  soft-tissue]
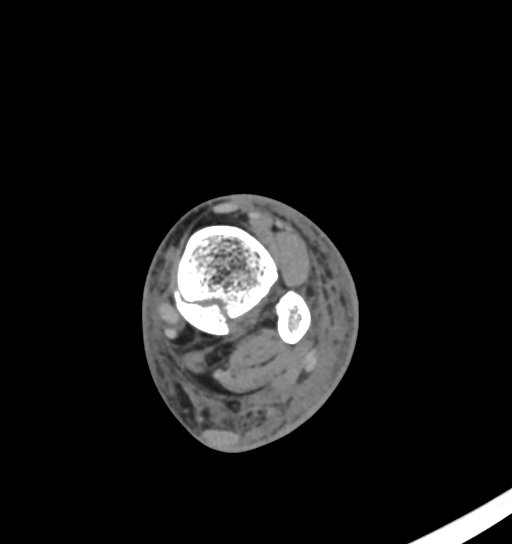
[im 116/189  bone]
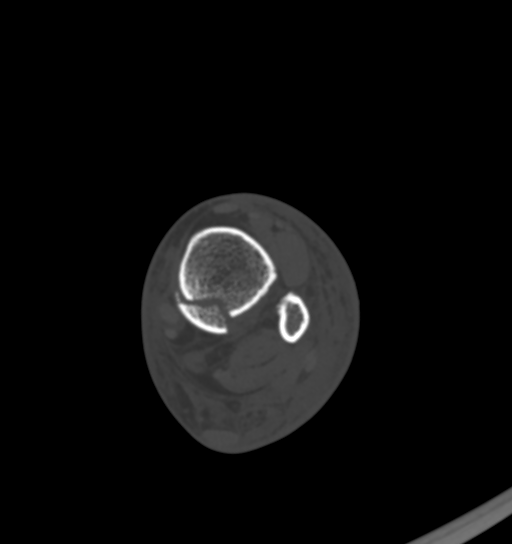
[im 145/189  bone]
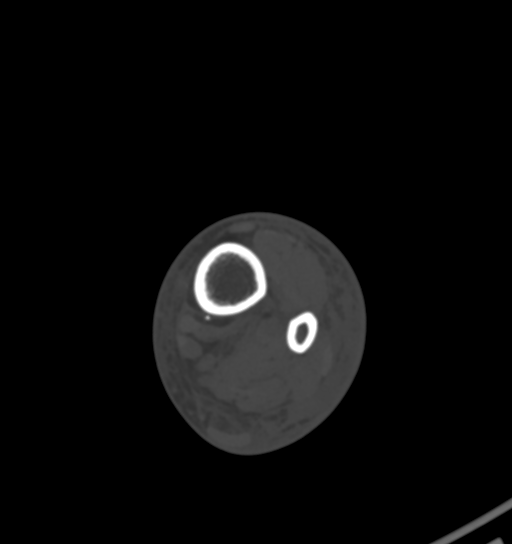
[im 174/189  bone]
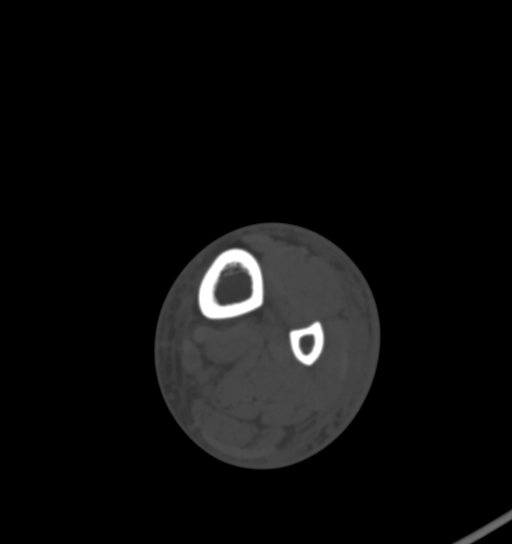

[Series 8: cor st · coronal · 0.35mm/px · 3 of 181 slices shown]
[im 37/181  bone]
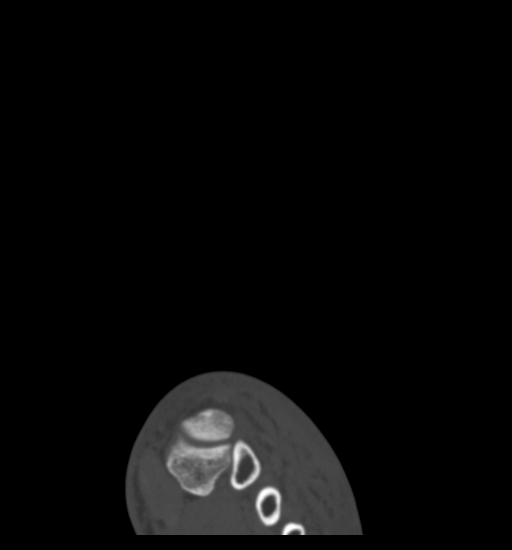
[im 73/181  bone]
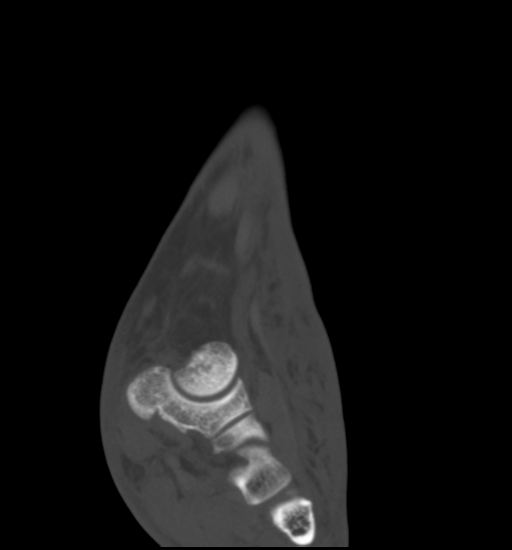
[im 109/181  bone]
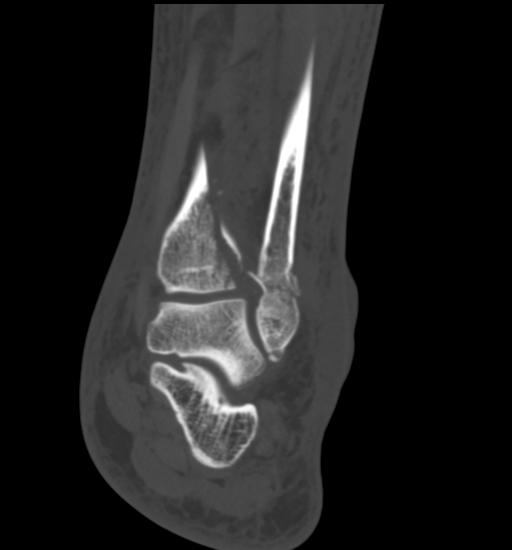

[Series 9: sag st · sagittal · 0.37mm/px · 5 of 98 slices shown, 6 images]
[im 33/98  bone]
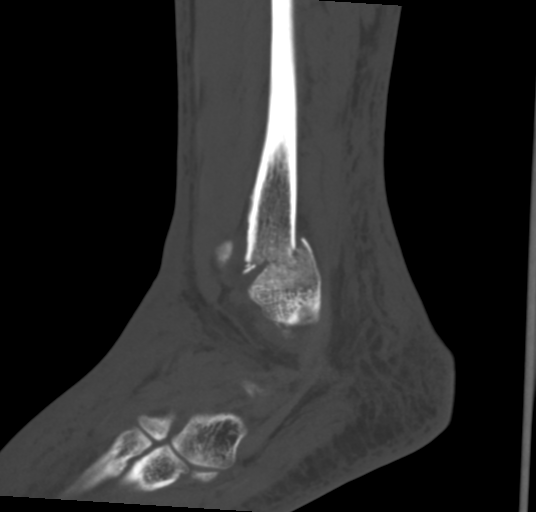
[im 41/98  bone]
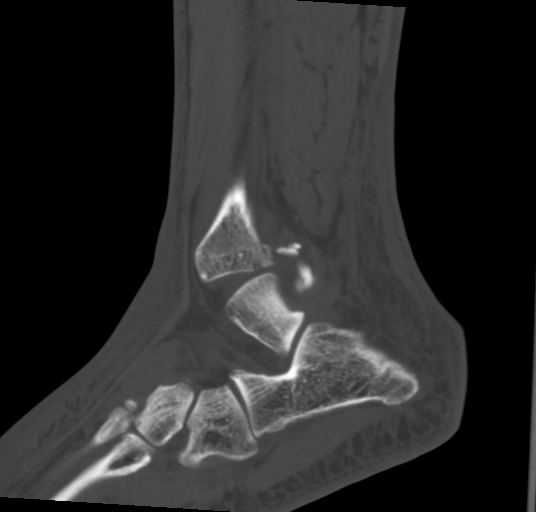
[im 49/98  soft-tissue]
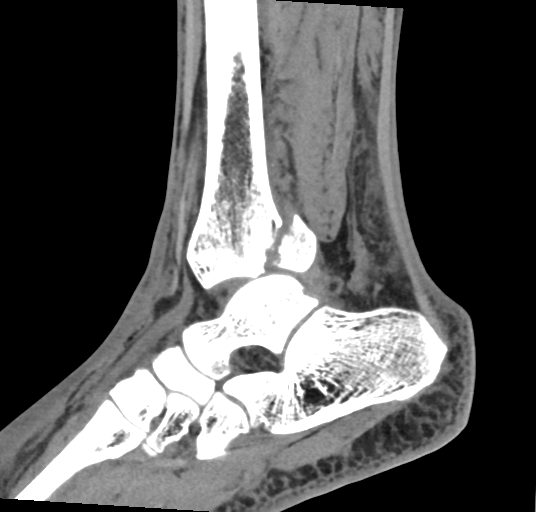
[im 49/98  bone]
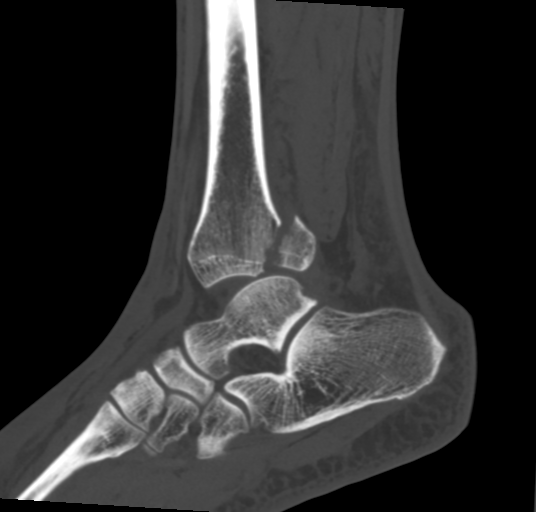
[im 57/98  bone]
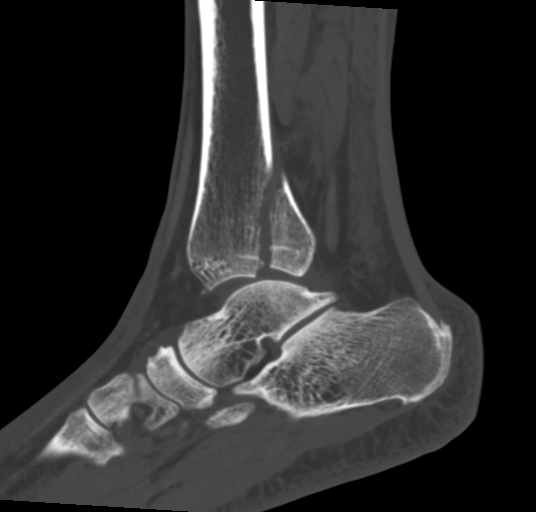
[im 65/98  bone]
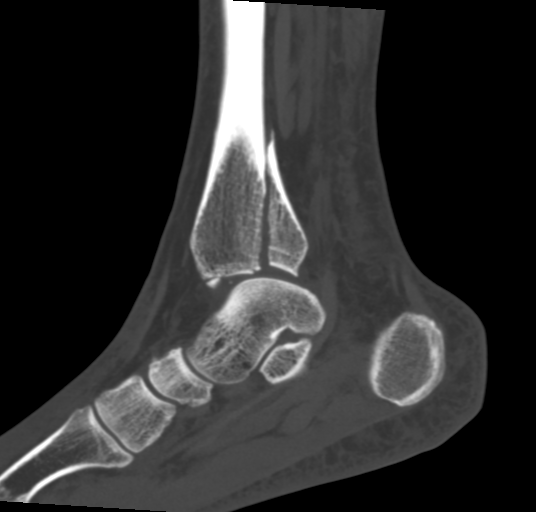

[15 of 33 positions shown; findings below may reference images not displayed]

FINDINGS: Bones/Joint/Cartilage

The patient has a mildly comminuted fracture of the distal fibula
which originates in the posterior cortex of the fibula approximately
3.3 cm above the tip of the lateral malleolus and exits the anterior
cortex approximately 2 cm above the tip of the lateral malleolus.
The distal fragment demonstrates 0.4 cm posterior displacement and
minimal impaction.

Also seen is a fracture of the posterior malleolus. The main
fracture fragment measures 1.5 cm AP by 3.2 cm transverse at the
plafond and 5.1 cm craniocaudal. Superior displacement of the
fragment results in a step-off at the plafond of 0.5 cm. This
fragment is posteriorly displaced approximately 0.6 cm.

Mildly comminuted fracture of the medial malleolus demonstrates
nondisplaced.

The talus is posteriorly subluxed approximately 1 cm off of the
tibial plafond.

No other bony or joint abnormality is identified.

Ligaments

Suboptimally assessed by CT.

Muscles and Tendons

Tendons appear intact.  No entrapment.

Soft tissues

Soft tissue contusion and hematoma are seen about the patient's
fractures.
IMPRESSION: Acute trimalleolar fracture and posterior subluxation of the talus
out of the tibiotalar joint as described above.
